# Patient Record
Sex: Female | Born: 2000 | Race: Black or African American | Hispanic: No | Marital: Single | Smoking: Never smoker
Health system: Southern US, Community
[De-identification: ages and names within clinical notes are randomized; demographics above are authoritative.]

---
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
## 2022-04-21 DIAGNOSIS — Z113 Encounter for screening for infections with a predominantly sexual mode of transmission: Secondary | ICD-10-CM | POA: Diagnosis not present

## 2022-04-21 DIAGNOSIS — Z0389 Encounter for observation for other suspected diseases and conditions ruled out: Secondary | ICD-10-CM | POA: Diagnosis not present

## 2022-04-21 DIAGNOSIS — Z30011 Encounter for initial prescription of contraceptive pills: Secondary | ICD-10-CM | POA: Diagnosis not present

## 2022-12-01 DIAGNOSIS — H6122 Impacted cerumen, left ear: Secondary | ICD-10-CM | POA: Diagnosis not present

## 2022-12-01 DIAGNOSIS — H6692 Otitis media, unspecified, left ear: Secondary | ICD-10-CM | POA: Diagnosis not present

## 2022-12-02 ENCOUNTER — Telehealth: Payer: Self-pay

## 2022-12-02 NOTE — Telephone Encounter (Signed)
Patient's mother calls nurse line regarding abx for ear infection. Patient was seen at [facility] urgent care yesterday and received rx for amoxicillin clavulanate 875-125 mg. Called pharmacy and asked for details on prescription. This was sent over by Thea Gist, PA at [facility] in [place]. Provider is not enrolled in [place] Medicaid.   Directions are to take 1 tablet every 12 hours for seven days. Dispense quantity 14 with 0 refills.   Please advise if this prescription can be sent to [facility] on [address].   I also advised mother that patient needs to schedule appointment with PCP, as she has not been seen since 2021.

## 2022-12-03 MED ORDER — AMOXICILLIN-POT CLAVULANATE 875-125 MG PO TABS: 1.0000 | ORAL_TABLET | Freq: Two times a day (BID) | ORAL | 0 refills | Status: AC

## 2022-12-03 NOTE — Telephone Encounter (Signed)
Rx sent as requested.

## 2023-02-15 DIAGNOSIS — N760 Acute vaginitis: Secondary | ICD-10-CM | POA: Diagnosis not present

## 2023-02-15 DIAGNOSIS — Z113 Encounter for screening for infections with a predominantly sexual mode of transmission: Secondary | ICD-10-CM | POA: Diagnosis not present

## 2023-03-24 ENCOUNTER — Telehealth: Payer: Self-pay

## 2023-03-24 NOTE — Telephone Encounter (Signed)
LVM for patient to call back. AS, CMA 

## 2023-06-16 DIAGNOSIS — N760 Acute vaginitis: Secondary | ICD-10-CM | POA: Diagnosis not present

## 2023-06-16 DIAGNOSIS — Z114 Encounter for screening for human immunodeficiency virus [HIV]: Secondary | ICD-10-CM | POA: Diagnosis not present

## 2023-06-16 DIAGNOSIS — Z113 Encounter for screening for infections with a predominantly sexual mode of transmission: Secondary | ICD-10-CM | POA: Diagnosis not present

## 2023-09-11 DIAGNOSIS — M25571 Pain in right ankle and joints of right foot: Secondary | ICD-10-CM | POA: Diagnosis not present

## 2023-09-11 DIAGNOSIS — S99921A Unspecified injury of right foot, initial encounter: Secondary | ICD-10-CM | POA: Diagnosis not present

## 2023-09-11 DIAGNOSIS — M79671 Pain in right foot: Secondary | ICD-10-CM | POA: Diagnosis not present

## 2023-09-11 DIAGNOSIS — S93601A Unspecified sprain of right foot, initial encounter: Secondary | ICD-10-CM | POA: Diagnosis not present

## 2023-09-11 DIAGNOSIS — X501XXA Overexertion from prolonged static or awkward postures, initial encounter: Secondary | ICD-10-CM | POA: Diagnosis not present

## 2024-09-18 DIAGNOSIS — Z3009 Encounter for other general counseling and advice on contraception: Secondary | ICD-10-CM | POA: Diagnosis not present

## 2024-09-18 DIAGNOSIS — Z3202 Encounter for pregnancy test, result negative: Secondary | ICD-10-CM | POA: Diagnosis not present

## 2024-09-18 DIAGNOSIS — Z30011 Encounter for initial prescription of contraceptive pills: Secondary | ICD-10-CM | POA: Diagnosis not present

## 2024-09-18 DIAGNOSIS — Z01419 Encounter for gynecological examination (general) (routine) without abnormal findings: Secondary | ICD-10-CM | POA: Diagnosis not present
